# Patient Record
Sex: Male | Born: 2000 | Race: Black or African American | Hispanic: No | Marital: Single | Smoking: Current every day smoker
Health system: Southern US, Community
[De-identification: ages and names within clinical notes are randomized; demographics above are authoritative.]

---
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
## 2020-12-11 ENCOUNTER — Encounter: Payer: Self-pay | Admitting: Family Medicine

## 2020-12-11 ENCOUNTER — Ambulatory Visit: Payer: Self-pay | Admitting: Family Medicine

## 2020-12-11 DIAGNOSIS — Z202 Contact with and (suspected) exposure to infections with a predominantly sexual mode of transmission: Secondary | ICD-10-CM

## 2020-12-11 DIAGNOSIS — Z113 Encounter for screening for infections with a predominantly sexual mode of transmission: Secondary | ICD-10-CM

## 2020-12-11 LAB — GRAM STAIN

## 2020-12-11 MED ORDER — DOXYCYCLINE HYCLATE 100 MG PO TABS: 100.0000 mg | ORAL_TABLET | Freq: Two times a day (BID) | ORAL | 0 refills | Status: AC

## 2020-12-11 NOTE — Progress Notes (Signed)
   [facility] Department STI clinic/screening visit  Subjective:  Seth Russell is a 20 y.o. male being seen today for an STI screening visit. The patient reports they do not have symptoms.    Patient has the following medical conditions:  There are no problems to display for this patient.    Chief Complaint  Patient presents with  . SEXUALLY TRANSMITTED DISEASE    Screening     HPI  Patient reports here for screening, contact to chlamydia    See flowsheet for further details and programmatic requirements.    The following portions of the patient's history were reviewed and updated as appropriate: allergies, current medications, past medical history, past social history, past surgical history and problem list.  Objective:  There were no vitals filed for this visit.  Physical Exam Constitutional:      Appearance: Normal appearance.  HENT:     Head: Normocephalic.     Mouth/Throat:     Mouth: Mucous membranes are moist.     Pharynx: Oropharynx is clear. No oropharyngeal exudate.  Pulmonary:     Effort: Pulmonary effort is normal.  Genitourinary:    Penis: Normal.      Testes: Normal.     Comments: No lice, nits, or pest, no lesions or odor discharge.  Denies pain or tenderness with paplation of testicles.  No lesions, ulcers or masses present.    Musculoskeletal:     Cervical back: Normal range of motion.  Lymphadenopathy:     Cervical: No cervical adenopathy.  Skin:    General: Skin is warm and dry.     Findings: No bruising, erythema, lesion or rash.  Neurological:     Mental Status: He is alert and oriented to person, place, and time.  Psychiatric:        Mood and Affect: Mood normal.        Behavior: Behavior normal.       Assessment and Plan:  Seth Russell is a 20 y.o. male presenting to the [facility] Department for STI screening  1. Screening examination for venereal disease  Patient does not have STI  symptoms Patient accepted all screenings including  Gram stain, urethral  GC and bloodwork for HIV/RPR.  Patient meets criteria for HepB screening? No. Ordered? No - does not meet criteria  Patient meets criteria for HepC screening? No. Ordered? No - does not meet criteria  Recommended condom use with all sex Discussed importance of condom use for STI prevent  Treat gram stain as + chlamydia  Discussed time line for [facility] results and that patient will be called with positive results and encouraged patient to call if he had not heard in 2 weeks Recommended returning for continued or worsening symptoms.   - HIV [facility] [facility] - Gonococcus culture - Syphilis Serology, [facility] - Gram stain   2. Exposure to chlamydia - doxycycline (VIBRA-TABS) 100 MG tablet; Take 1 tablet (100 mg total) by mouth 2 (two) times daily for 7 days.  Dispense: 14 tablet; Refill: 0   Return for as needed.  No future appointments.  Wendi Snipes, FNP

## 2020-12-12 LAB — HM HIV SCREENING LAB: HM HIV Screening: NEGATIVE

## 2020-12-15 LAB — GONOCOCCUS CULTURE

## 2020-12-25 ENCOUNTER — Telehealth: Payer: Self-pay | Admitting: Family Medicine

## 2020-12-25 NOTE — Telephone Encounter (Signed)
Pt states that the Provider told him to take Doxycycline 1 tab each day, but today, he noticed the bottle said take 1 tablet twice a day.  Pt states that he has 4 tablets left.  Informed pt to take 1 tablet twice a day for the next two days.

## 2020-12-25 NOTE — Telephone Encounter (Signed)
Patient called to speak with someone about his medication. Asked him if he could provide me the name of his meds, spelled out doxycycline? He'd like a call back. Thank you!

## 2020-12-30 ENCOUNTER — Ambulatory Visit: Payer: Self-pay | Admitting: Physician Assistant

## 2020-12-30 ENCOUNTER — Other Ambulatory Visit: Payer: Self-pay

## 2020-12-30 DIAGNOSIS — Z113 Encounter for screening for infections with a predominantly sexual mode of transmission: Secondary | ICD-10-CM

## 2020-12-30 DIAGNOSIS — Z299 Encounter for prophylactic measures, unspecified: Secondary | ICD-10-CM

## 2020-12-31 MED ORDER — DOXYCYCLINE HYCLATE 100 MG PO TABS: 100.0000 mg | ORAL_TABLET | Freq: Two times a day (BID) | ORAL | 0 refills | Status: AC

## 2020-12-31 NOTE — Progress Notes (Signed)
Chart reviewed by Pharmacist  Suzanne Walker PharmD, Contract Pharmacist at [facility]  

## 2020-12-31 NOTE — Progress Notes (Signed)
S:  Patient into clinic requesting re-treatment as a contact to Chlamydia.  States that he and his partner did not take the medicine correctly and they also had sex prior to completing the medicine.  States that his partner was rechecked and still positive for Chlamydia and got her medicine 2 days ago.  Denies changes to history since his last visit.  Declines any screening to day and requests re-treatment only. NKDA. O:  WDWN male in NAD, A&O x 3, normal work of breathing. A/P:  1.  Patient needs re-treatment as a contact to Chlamydia. 2.  Will re-treat patient with Doxycycline 100 mg #14 1 po BID for 7 days. The patient was dispensed Doxycycline today. I provided counseling today regarding the medication. We discussed the medication, the side effects and when to call clinic. Patient given the opportunity to ask questions. Questions answered.  3.  No sex for 14 days and until after partner completes treatment. 4.  Rec condoms with all sex. 5.  RTC prn.

## 2021-04-02 ENCOUNTER — Other Ambulatory Visit: Payer: Self-pay

## 2021-04-02 ENCOUNTER — Emergency Department
Admission: EM | Admit: 2021-04-02 | Discharge: 2021-04-02 | Disposition: A | Discharge status: Home / Self Care | Payer: Self-pay | Attending: Emergency Medicine | Admitting: Emergency Medicine

## 2021-04-02 DIAGNOSIS — F17290 Nicotine dependence, other tobacco product, uncomplicated: Secondary | ICD-10-CM | POA: Insufficient documentation

## 2021-04-02 DIAGNOSIS — L02214 Cutaneous abscess of groin: Secondary | ICD-10-CM | POA: Insufficient documentation

## 2021-04-02 MED ORDER — SULFAMETHOXAZOLE-TRIMETHOPRIM 800-160 MG PO TABS
1.0000 | ORAL_TABLET | Freq: Once | ORAL | Status: AC
  Administered 2021-04-02: 1 via ORAL
  Filled 2021-04-02: qty 1

## 2021-04-02 MED ORDER — LIDOCAINE HCL (PF) 1 % IJ SOLN
5.0000 mL | Freq: Once | INTRAMUSCULAR | Status: AC
  Administered 2021-04-02: 5 mL via INTRADERMAL
  Filled 2021-04-02: qty 5

## 2021-04-02 MED ORDER — IBUPROFEN 600 MG PO TABS: 600.0000 mg | ORAL_TABLET | Freq: Four times a day (QID) | ORAL | 0 refills | Status: AC | PRN

## 2021-04-02 MED ORDER — HYDROCODONE-ACETAMINOPHEN 5-325 MG PO TABS
1.0000 | ORAL_TABLET | ORAL | Status: AC
  Administered 2021-04-02: 1 via ORAL
  Filled 2021-04-02: qty 1

## 2021-04-02 MED ORDER — SULFAMETHOXAZOLE-TRIMETHOPRIM 800-160 MG PO TABS: 1.0000 | ORAL_TABLET | Freq: Two times a day (BID) | ORAL | 0 refills | Status: AC

## 2021-04-02 NOTE — ED Provider Notes (Signed)
[facility] EMERGENCY DEPARTMENT Provider Note   CSN: [id number] Arrival date & time: 04/02/21  1744     History Chief Complaint  Patient presents with   Groin Swelling    Seth Russell is a 20 y.o. male presents to the emergency department for evaluation of right groin pain.  He has a fluctuant area of swelling in the right groin.  He has noticed the pain and swelling yesterday.  He describes tightness, swelling worse with walking.  No scrotal pain, swelling or testicular discomfort.  No urinary symptoms or penile discharge.  Denies any back pain numbness tingling radicular symptoms.  HPI     History reviewed. No pertinent past medical history.  There are no problems to display for this patient.   History reviewed. No pertinent surgical history.     No family history on file.  Social History   Tobacco Use   Smoking status: Every Day    Packs/day: 0.25    Types: Cigars, Cigarettes   Smokeless tobacco: Never  Vaping Use   Vaping Use: Every day   Start date: 12/11/2016   Substances: Nicotine, Flavoring  Substance Use Topics   Alcohol use: Yes    Alcohol/week: 8.0 standard drinks    Types: 8 Shots of liquor per week    Comment: weekends only   Drug use: No    Home Medications Prior to Admission medications   Medication Sig Start Date End Date Taking? Authorizing Provider  ibuprofen (ADVIL) 600 MG tablet Take 1 tablet (600 mg total) by mouth every 6 (six) hours as needed for moderate pain. 04/02/21  Yes Evon Slack, PA-C  sulfamethoxazole-trimethoprim (BACTRIM DS) 800-160 MG tablet Take 1 tablet by mouth 2 (two) times daily. 04/02/21  Yes Evon Slack, PA-C  amoxicillin (AMOXIL) 500 MG capsule Take 1 capsule (500 mg total) by mouth 3 (three) times daily. Patient not taking: Reported on 12/30/2020 08/20/18   Joni Reining, PA-C  lidocaine (XYLOCAINE) 2 % solution Use as directed 5 mLs in the mouth or throat every 6 (six) hours as needed  for mouth pain. Oral swish Patient not taking: Reported on 12/30/2020 08/20/18   Joni Reining, PA-C  naproxen (NAPROSYN) 500 MG tablet Take 1 tablet (500 mg total) 2 (two) times daily with a meal by mouth. Patient not taking: Reported on 12/30/2020 06/20/17   Joni Reining, PA-C  traMADol (ULTRAM) 50 MG tablet Take 1 tablet (50 mg total) by mouth every 12 (twelve) hours as needed. Patient not taking: Reported on 12/30/2020 08/20/18   Joni Reining, PA-C    Allergies    Patient has no known allergies.  Review of Systems   Review of Systems  Constitutional:  Negative for chills and fever.  Genitourinary:  Negative for dysuria, penile discharge, penile pain and scrotal swelling.  Musculoskeletal:  Negative for gait problem and joint swelling.  Skin:  Positive for wound. Negative for rash.   Physical Exam Updated Vital Signs BP (!) 121/57 (BP Location: Left Arm)   Pulse 65   Temp 98.9 F (37.2 C) (Oral)   Resp 20   Ht 5\' 8"  (1.727 m)   Wt 93 kg   SpO2 98%   BMI 31.17 kg/m   Physical Exam Constitutional:      Appearance: He is well-developed.  HENT:     Head: Normocephalic and atraumatic.  Eyes:     Conjunctiva/sclera: Conjunctivae normal.  Cardiovascular:     Rate and Rhythm:  Normal rate.  Pulmonary:     Effort: Pulmonary effort is normal. No respiratory distress.  Musculoskeletal:        General: Normal range of motion.     Cervical back: Normal range of motion.     Comments: In the rightRight groin with normal range of motion with no discomfort.  In the groin there is a 3 cm diameter area of fluctuance and tenderness consistent with a fluctuant abscess.  I&D performed and significant amount of purulent material expressed.  Abscess to the right the scrotum, no tracking into the scrotal region.  Skin:    General: Skin is warm.     Findings: No rash.  Neurological:     Mental Status: He is alert and oriented to person, place, and time.  Psychiatric:        Behavior:  Behavior normal.        Thought Content: Thought content normal.    ED Results / Procedures / Treatments   Labs (all labs ordered are listed, but only abnormal results are displayed) Labs Reviewed - No data to display  EKG None  Radiology No results found.  Procedures .Marland KitchenIncision and Drainage  Date/Time: 04/02/2021 8:36 PM Performed by: Evon Slack, PA-C Authorized by: Evon Slack, PA-C   Consent:    Consent obtained:  Verbal   Consent given by:  Patient Universal protocol:    Patient identity confirmed:  Verbally with patient Location:    Type:  Abscess   Size:  3 cm   Location: Right groin. Pre-procedure details:    Skin preparation:  Antiseptic wash and chlorhexidine with alcohol Anesthesia:    Anesthesia method:  Local infiltration   Local anesthetic:  Lidocaine 1% w/o epi Procedure type:    Complexity:  Simple Procedure details:    Ultrasound guidance: no     Incision types:  Stab incision   Incision depth:  Dermal   Drainage:  Purulent   Drainage amount:  Copious   Wound treatment:  Drain placed   Packing materials:  1/4 in iodoform gauze   Amount 1/4" iodoform:  3 inches Post-procedure details:    Procedure completion:  Tolerated well, no immediate complications Comments:     Copious amounts of purulent discharge removed from abscess, no active bleeding.  Complete decompression of abscess performed.  Patient tolerated procedure well.   Medications Ordered in ED Medications  sulfamethoxazole-trimethoprim (BACTRIM DS) 800-160 MG per tablet 1 tablet (has no administration in time range)  HYDROcodone-acetaminophen (NORCO/VICODIN) 5-325 MG per tablet 1 tablet (1 tablet Oral Given 04/02/21 1951)  lidocaine (PF) (XYLOCAINE) 1 % injection 5 mL (5 mLs Intradermal Given by Other 04/02/21 1951)    ED Course  I have reviewed the triage vital signs and the nursing notes.  Pertinent labs & imaging results that were available during my care of the patient  were reviewed by me and considered in my medical decision making (see chart for details).    MDM Rules/Calculators/A&P                         20 year old with right groin abscess.  Groin abscess was drained and packed with iodoform.  Complete decompression of abscess performed.  Patient saw improvement in pain.  Is placed on antibiotics.  He will follow-up in 2 to 3 days for packing removal and wound check.  He understands signs symptoms return to the ER for. Final Clinical Impression(s) / ED Diagnoses Final diagnoses:  Abscess of groin, right    Rx / DC Orders ED Discharge Orders          Ordered    sulfamethoxazole-trimethoprim (BACTRIM DS) 800-160 MG tablet  2 times daily        04/02/21 2033    ibuprofen (ADVIL) 600 MG tablet  Every 6 hours PRN        04/02/21 2033             Evon Slack, PA-C 04/02/21 2040    Gilles Chiquito, MD 04/03/21 1056

## 2021-04-02 NOTE — Discharge Instructions (Addendum)
Please follow-up with walk-in clinic, primary care provider or ER in 2 to 3 days.  Packing removal and wound check.  Take antibiotics as prescribed.  Return to the ER sooner for any fevers increasing pain swelling worsening symptoms or urgent changes in health.

## 2021-04-02 NOTE — ED Triage Notes (Signed)
Pt come with c/o groin swelling. Pt states bump and he tried to pop with no success. Pt states this started two days ago.
